# Patient Record
Sex: Female | Born: 2000 | Race: White | Hispanic: No | Marital: Single | Smoking: Never smoker
Health system: Southern US, Community
[De-identification: ages and names within clinical notes are randomized; demographics above are authoritative.]

---
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
## 2021-02-02 ENCOUNTER — Emergency Department (HOSPITAL_BASED_OUTPATIENT_CLINIC_OR_DEPARTMENT_OTHER)
Admission: EM | Admit: 2021-02-02 | Discharge: 2021-02-03 | Disposition: A | Discharge status: Home / Self Care | Payer: BC Managed Care – PPO | Attending: Emergency Medicine | Admitting: Emergency Medicine

## 2021-02-02 ENCOUNTER — Other Ambulatory Visit: Payer: Self-pay

## 2021-02-02 ENCOUNTER — Encounter (HOSPITAL_BASED_OUTPATIENT_CLINIC_OR_DEPARTMENT_OTHER): Payer: Self-pay | Admitting: *Deleted

## 2021-02-02 DIAGNOSIS — Y9366 Activity, soccer: Secondary | ICD-10-CM | POA: Diagnosis not present

## 2021-02-02 DIAGNOSIS — W500XXA Accidental hit or strike by another person, initial encounter: Secondary | ICD-10-CM | POA: Insufficient documentation

## 2021-02-02 DIAGNOSIS — S0591XA Unspecified injury of right eye and orbit, initial encounter: Secondary | ICD-10-CM | POA: Diagnosis present

## 2021-02-02 DIAGNOSIS — S01111A Laceration without foreign body of right eyelid and periocular area, initial encounter: Secondary | ICD-10-CM | POA: Diagnosis not present

## 2021-02-02 DIAGNOSIS — Z23 Encounter for immunization: Secondary | ICD-10-CM | POA: Diagnosis not present

## 2021-02-02 DIAGNOSIS — S0181XA Laceration without foreign body of other part of head, initial encounter: Secondary | ICD-10-CM

## 2021-02-02 MED ORDER — TETANUS-DIPHTH-ACELL PERTUSSIS 5-2.5-18.5 LF-MCG/0.5 IM SUSY
0.5000 mL | PREFILLED_SYRINGE | Freq: Once | INTRAMUSCULAR | Status: AC
  Administered 2021-02-03: 0.5 mL via INTRAMUSCULAR
  Filled 2021-02-02: qty 0.5

## 2021-02-02 MED ORDER — LIDOCAINE HCL (PF) 1 % IJ SOLN
5.0000 mL | Freq: Once | INTRAMUSCULAR | Status: AC
  Administered 2021-02-03: 5 mL
  Filled 2021-02-02: qty 5

## 2021-02-02 NOTE — ED Triage Notes (Signed)
Pt reports she had cleats come in contact to her face during soccer game this evening. Dermabond was applied on field to right eyebrow wound so she could finish the half. She is here to have wound cleaned out and see if she needs stitches. Denies LOC

## 2021-02-03 MED ORDER — CEPHALEXIN 500 MG PO CAPS: 500.0000 mg | ORAL_CAPSULE | Freq: Three times a day (TID) | ORAL | 0 refills | Status: AC

## 2021-02-03 NOTE — ED Provider Notes (Signed)
[facility] Emergency Department Provider Note MRN:  [id number]  Arrival date & time: 02/03/21     Chief Complaint   Head Injury   History of Present Illness   Denise Dougherty is a 20 y.o. year-old female with no pertinent past medical history presenting to the ED with chief complaint of head injury.  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  Was struck in the face with the other players cleat.  Sustained laceration to the right brow, also has some bruising to the nose.  Some glue was placed on the laceration so that she could quickly get back into the game.  Here for evaluation.  Denies any loss of consciousness, no nausea or vomiting, no neck pain, no other significant injuries.  Pain is mild, constant, worse with motion or palpation to the area.  Review of Systems  A complete 10 system review of systems was obtained and all systems are negative except as noted in the HPI and PMH.   Patient's Health History   History reviewed. No pertinent past medical history.  History reviewed. No pertinent surgical history.  No family history on file.  Social History   Socioeconomic History   Marital status: Single    Spouse name: Not on file   Number of children: Not on file   Years of education: Not on file   Highest education level: Not on file  Occupational History   Not on file  Tobacco Use   Smoking status: Never   Smokeless tobacco: Not on file  Vaping Use   Vaping Use: Never used  Substance and Sexual Activity   Alcohol use: Not Currently   Drug use: Never   Sexual activity: Not on file  Other Topics Concern   Not on file  Social History Narrative   Not on file   Social Determinants of Health   Financial Resource Strain: Not on file  Food Insecurity: Not on file  Transportation Needs: Not on file  Physical Activity: Not on file  Stress: Not on file  Social Connections: Not on file   Intimate Partner Violence: Not on file     Physical Exam   Vitals:   02/03/21 0010  BP: 132/87  Pulse: (!) 55  Resp: 18  Temp: 98.2 F (36.8 C)  SpO2: 100%    CONSTITUTIONAL: Well-appearing, NAD NEURO:  Alert and oriented x 3, no focal deficits EYES:  eyes equal and reactive ENT/NECK:  no LAD, no JVD CARDIO: Regular rate, well-perfused, normal S1 and S2 PULM:  CTAB no wheezing or rhonchi GI/GU:  normal bowel sounds, non-distended, non-tender MSK/SPINE:  No gross deformities, no edema SKIN: Laceration to the lateral right brow covered in dried blood, bruising and edema to the base of the nose PSYCH:  Appropriate speech and behavior  *Additional and/or pertinent findings included in MDM below  Diagnostic and Interventional Summary    EKG Interpretation  Date/Time:    Ventricular Rate:    PR Interval:    QRS Duration:   QT Interval:    QTC Calculation:   R Axis:     Text Interpretation:          Labs Reviewed - No data to display  No orders to display    Medications  Tdap (BOOSTRIX) injection 0.5 mL (0.5 mLs Intramuscular Given 02/03/21 0003)  lidocaine (PF) (XYLOCAINE) 1 % injection 5 mL (5 mLs Infiltration Given 02/03/21 0003)  Procedures  /  Critical Care .Marland KitchenLaceration Repair  Date/Time: 02/03/2021 12:42 AM Performed by: Sabas Sous, MD Authorized by: Sabas Sous, MD   Consent:    Consent obtained:  Verbal   Consent given by:  Patient   Risks, benefits, and alternatives were discussed: yes     Risks discussed:  Infection, need for additional repair, nerve damage, pain, poor cosmetic result, poor wound healing, retained foreign body, tendon damage and vascular damage   Alternatives discussed:  No treatment Universal protocol:    Procedure explained and questions answered to patient or proxy's satisfaction: yes     Immediately prior to procedure, a time out was called: yes     Patient identity confirmed:  Verbally with patient Anesthesia:     Anesthesia method:  Local infiltration   Local anesthetic:  Lidocaine 1% w/o epi Laceration details:    Location:  Face   Face location:  R eyebrow   Length (cm):  2   Depth (mm):  2 Pre-procedure details:    Preparation:  Patient was prepped and draped in usual sterile fashion Exploration:    Hemostasis achieved with:  Direct pressure   Wound exploration: wound explored through full range of motion and entire depth of wound visualized     Contaminated: yes   Treatment:    Area cleansed with:  Saline   Amount of cleaning:  Extensive   Debridement:  Minimal Skin repair:    Repair method:  Sutures   Suture size:  5-0   Suture material:  Prolene   Suture technique:  Simple interrupted   Number of sutures:  3 Approximation:    Approximation:  Close Repair type:    Repair type:  Intermediate Post-procedure details:    Dressing:  Open (no dressing)   Procedure completion:  Tolerated well, no immediate complications Comments:     Wound was covered with Dermabond that had not properly closed the wound.  Wound was anesthetized and then required some debridement of the dried Dermabond/blood.  Inside of the wound minimally contaminated with Dermabond/blood, thoroughly rinsed and removed.  ED Course and Medical Decision Making  I have reviewed the triage vital signs, the nursing notes, and pertinent available records from the EMR.  Listed above are laboratory and imaging tests that I personally ordered, reviewed, and interpreted and then considered in my medical decision making (see below for details).  On my initial evaluation of the laceration with some wet gauze, the wound edges seem to be coming apart, and so will use lidocaine, wash the wound thoroughly as it may be contaminated, and placed sutures.  Will update tetanus today.  There is a possibility of a nasal fracture but the benefit of imaging is likely not present, other does not seem to be any displacement, breathing through the  nose without issue, no evidence of septal hematoma.     Procedural details described above.  Given the contamination will provide prophylactic antibiotics.  Appropriate for discharge.  Elmer Sow. Pilar Plate, MD [facility] Emergency Medicine [facility] [email or web address]  Final Clinical Impressions(s) / ED Diagnoses     ICD-10-CM   1. Laceration of brow without complication, initial encounter  S01.81XA       ED Discharge Orders          Ordered    cephALEXin (KEFLEX) 500 MG capsule  3 times daily        02/03/21 0039  Discharge Instructions Discussed with and Provided to Patient:     Discharge Instructions      You were evaluated in the Emergency Department and after careful evaluation, we did not find any emergent condition requiring admission or further testing in the hospital.  Your exam/testing today was overall reassuring.  We reopened your laceration, washed it thoroughly, and placed stitches.  To prevent infection please take the Keflex antibiotic as directed.  Your stitches will need to be removed in 5 to 7 days by healthcare professional.  Regarding your nose, recommend cold compresses, Tylenol, Motrin for the next 2 days.  There may be an underlying broken nose, but it should heal without intervention.  If for some reason you are having continued discomfort or are unhappy with the way it looks in 2 weeks, recommend follow-up with a ear nose and throat specialist.  Please return to the Emergency Department if you experience any worsening of your condition.  Thank you for allowing [place] to be a part of your care.         Sabas Sous, MD 02/03/21 [phone]

## 2021-02-03 NOTE — Discharge Instructions (Addendum)
You were evaluated in the Emergency Department and after careful evaluation, we did not find any emergent condition requiring admission or further testing in the hospital.  Your exam/testing today was overall reassuring.  We reopened your laceration, washed it thoroughly, and placed stitches.  To prevent infection please take the Keflex antibiotic as directed.  Your stitches will need to be removed in 5 to 7 days by healthcare professional.  Regarding your nose, recommend cold compresses, Tylenol, Motrin for the next 2 days.  There may be an underlying broken nose, but it should heal without intervention.  If for some reason you are having continued discomfort or are unhappy with the way it looks in 2 weeks, recommend follow-up with a ear nose and throat specialist.  Please return to the Emergency Department if you experience any worsening of your condition.  Thank you for allowing [place] to be a part of your care.
